# Patient Record
Sex: Female | Born: 1950 | Race: White | Hispanic: No
Health system: Southern US, Community
[De-identification: ages and names within clinical notes are randomized; demographics above are authoritative.]

## PROBLEM LIST (undated history)

## (undated) DIAGNOSIS — I10 Essential (primary) hypertension: Secondary | ICD-10-CM

## (undated) HISTORY — DX: Essential (primary) hypertension: I10

## (undated) HISTORY — PX: OTHER SURGICAL HISTORY: SHX169

---

## 1997-12-29 ENCOUNTER — Other Ambulatory Visit: Admission: RE | Admit: 1997-12-29 | Discharge: 1997-12-29 | Payer: Self-pay | Admitting: Obstetrics and Gynecology

## 1999-01-03 ENCOUNTER — Other Ambulatory Visit: Admission: RE | Admit: 1999-01-03 | Discharge: 1999-01-03 | Payer: Self-pay | Admitting: Obstetrics and Gynecology

## 2000-01-05 ENCOUNTER — Other Ambulatory Visit: Admission: RE | Admit: 2000-01-05 | Discharge: 2000-01-05 | Payer: Self-pay | Admitting: Obstetrics and Gynecology

## 2001-01-07 ENCOUNTER — Other Ambulatory Visit: Admission: RE | Admit: 2001-01-07 | Discharge: 2001-01-07 | Payer: Self-pay | Admitting: Obstetrics and Gynecology

## 2013-09-30 ENCOUNTER — Ambulatory Visit: Payer: Self-pay | Admitting: Podiatrist

## 2017-08-01 DIAGNOSIS — E559 Vitamin D deficiency, unspecified: Secondary | ICD-10-CM | POA: Insufficient documentation

## 2017-08-01 DIAGNOSIS — E039 Hypothyroidism, unspecified: Secondary | ICD-10-CM | POA: Insufficient documentation

## 2017-08-01 DIAGNOSIS — G43009 Migraine without aura, not intractable, without status migrainosus: Secondary | ICD-10-CM | POA: Insufficient documentation

## 2017-08-01 DIAGNOSIS — I10 Essential (primary) hypertension: Secondary | ICD-10-CM | POA: Insufficient documentation

## 2017-12-22 DIAGNOSIS — J069 Acute upper respiratory infection, unspecified: Secondary | ICD-10-CM | POA: Diagnosis not present

## 2017-12-22 DIAGNOSIS — J209 Acute bronchitis, unspecified: Secondary | ICD-10-CM | POA: Diagnosis not present

## 2017-12-22 DIAGNOSIS — J309 Allergic rhinitis, unspecified: Secondary | ICD-10-CM | POA: Diagnosis not present

## 2018-03-06 DIAGNOSIS — M25512 Pain in left shoulder: Secondary | ICD-10-CM | POA: Diagnosis not present

## 2018-03-22 DIAGNOSIS — E7800 Pure hypercholesterolemia, unspecified: Secondary | ICD-10-CM | POA: Diagnosis not present

## 2018-03-22 DIAGNOSIS — E038 Other specified hypothyroidism: Secondary | ICD-10-CM | POA: Diagnosis not present

## 2018-03-22 DIAGNOSIS — I10 Essential (primary) hypertension: Secondary | ICD-10-CM | POA: Diagnosis not present

## 2018-03-22 DIAGNOSIS — Z6824 Body mass index (BMI) 24.0-24.9, adult: Secondary | ICD-10-CM | POA: Diagnosis not present

## 2018-03-22 DIAGNOSIS — F324 Major depressive disorder, single episode, in partial remission: Secondary | ICD-10-CM | POA: Diagnosis not present

## 2018-05-08 DIAGNOSIS — Z1231 Encounter for screening mammogram for malignant neoplasm of breast: Secondary | ICD-10-CM | POA: Diagnosis not present

## 2018-05-13 DIAGNOSIS — H2513 Age-related nuclear cataract, bilateral: Secondary | ICD-10-CM | POA: Diagnosis not present

## 2018-05-13 DIAGNOSIS — H52222 Regular astigmatism, left eye: Secondary | ICD-10-CM | POA: Diagnosis not present

## 2018-06-24 DIAGNOSIS — L570 Actinic keratosis: Secondary | ICD-10-CM | POA: Diagnosis not present

## 2018-06-24 DIAGNOSIS — L821 Other seborrheic keratosis: Secondary | ICD-10-CM | POA: Diagnosis not present

## 2018-08-14 DIAGNOSIS — D2339 Other benign neoplasm of skin of other parts of face: Secondary | ICD-10-CM | POA: Diagnosis not present

## 2018-09-23 DIAGNOSIS — J449 Chronic obstructive pulmonary disease, unspecified: Secondary | ICD-10-CM | POA: Diagnosis not present

## 2018-09-23 DIAGNOSIS — Z1211 Encounter for screening for malignant neoplasm of colon: Secondary | ICD-10-CM | POA: Diagnosis not present

## 2018-09-23 DIAGNOSIS — F17210 Nicotine dependence, cigarettes, uncomplicated: Secondary | ICD-10-CM | POA: Diagnosis not present

## 2018-09-23 DIAGNOSIS — E7800 Pure hypercholesterolemia, unspecified: Secondary | ICD-10-CM | POA: Diagnosis not present

## 2018-09-23 DIAGNOSIS — E785 Hyperlipidemia, unspecified: Secondary | ICD-10-CM | POA: Diagnosis not present

## 2018-09-23 DIAGNOSIS — Z0000 Encounter for general adult medical examination without abnormal findings: Secondary | ICD-10-CM | POA: Diagnosis not present

## 2018-09-23 DIAGNOSIS — Z23 Encounter for immunization: Secondary | ICD-10-CM | POA: Diagnosis not present

## 2018-09-23 DIAGNOSIS — Z79899 Other long term (current) drug therapy: Secondary | ICD-10-CM | POA: Diagnosis not present

## 2018-09-23 DIAGNOSIS — E038 Other specified hypothyroidism: Secondary | ICD-10-CM | POA: Diagnosis not present

## 2018-09-23 DIAGNOSIS — I480 Paroxysmal atrial fibrillation: Secondary | ICD-10-CM | POA: Diagnosis not present

## 2018-09-23 DIAGNOSIS — M8580 Other specified disorders of bone density and structure, unspecified site: Secondary | ICD-10-CM | POA: Diagnosis not present

## 2018-09-23 DIAGNOSIS — I10 Essential (primary) hypertension: Secondary | ICD-10-CM | POA: Diagnosis not present

## 2018-09-23 DIAGNOSIS — H25811 Combined forms of age-related cataract, right eye: Secondary | ICD-10-CM | POA: Diagnosis not present

## 2018-09-23 DIAGNOSIS — R0602 Shortness of breath: Secondary | ICD-10-CM | POA: Diagnosis not present

## 2018-09-23 DIAGNOSIS — J4520 Mild intermittent asthma, uncomplicated: Secondary | ICD-10-CM | POA: Diagnosis not present

## 2018-09-23 DIAGNOSIS — E291 Testicular hypofunction: Secondary | ICD-10-CM | POA: Diagnosis not present

## 2018-09-23 DIAGNOSIS — H5462 Unqualified visual loss, left eye, normal vision right eye: Secondary | ICD-10-CM | POA: Diagnosis not present

## 2018-11-13 DIAGNOSIS — M8589 Other specified disorders of bone density and structure, multiple sites: Secondary | ICD-10-CM | POA: Diagnosis not present

## 2018-11-13 DIAGNOSIS — M810 Age-related osteoporosis without current pathological fracture: Secondary | ICD-10-CM | POA: Diagnosis not present

## 2018-11-13 DIAGNOSIS — M85851 Other specified disorders of bone density and structure, right thigh: Secondary | ICD-10-CM | POA: Diagnosis not present

## 2018-11-26 DIAGNOSIS — F324 Major depressive disorder, single episode, in partial remission: Secondary | ICD-10-CM | POA: Diagnosis not present

## 2018-11-26 DIAGNOSIS — E038 Other specified hypothyroidism: Secondary | ICD-10-CM | POA: Diagnosis not present

## 2018-11-26 DIAGNOSIS — Z6824 Body mass index (BMI) 24.0-24.9, adult: Secondary | ICD-10-CM | POA: Diagnosis not present

## 2018-11-26 DIAGNOSIS — I10 Essential (primary) hypertension: Secondary | ICD-10-CM | POA: Diagnosis not present

## 2018-11-26 DIAGNOSIS — E7800 Pure hypercholesterolemia, unspecified: Secondary | ICD-10-CM | POA: Diagnosis not present

## 2019-02-24 DIAGNOSIS — E7800 Pure hypercholesterolemia, unspecified: Secondary | ICD-10-CM | POA: Diagnosis not present

## 2019-02-24 DIAGNOSIS — I10 Essential (primary) hypertension: Secondary | ICD-10-CM | POA: Diagnosis not present

## 2019-02-24 DIAGNOSIS — M810 Age-related osteoporosis without current pathological fracture: Secondary | ICD-10-CM | POA: Diagnosis not present

## 2019-02-24 DIAGNOSIS — Z6824 Body mass index (BMI) 24.0-24.9, adult: Secondary | ICD-10-CM | POA: Diagnosis not present

## 2019-02-24 DIAGNOSIS — E038 Other specified hypothyroidism: Secondary | ICD-10-CM | POA: Diagnosis not present

## 2019-04-07 DIAGNOSIS — M25512 Pain in left shoulder: Secondary | ICD-10-CM | POA: Diagnosis not present

## 2019-04-07 DIAGNOSIS — M47812 Spondylosis without myelopathy or radiculopathy, cervical region: Secondary | ICD-10-CM | POA: Diagnosis not present

## 2019-07-02 ENCOUNTER — Ambulatory Visit (INDEPENDENT_AMBULATORY_CARE_PROVIDER_SITE_OTHER): Payer: PPO | Admitting: Sports Medicine

## 2019-07-02 ENCOUNTER — Other Ambulatory Visit: Payer: Self-pay | Admitting: Sports Medicine

## 2019-07-02 ENCOUNTER — Ambulatory Visit (INDEPENDENT_AMBULATORY_CARE_PROVIDER_SITE_OTHER): Payer: PPO

## 2019-07-02 ENCOUNTER — Other Ambulatory Visit: Payer: Self-pay

## 2019-07-02 ENCOUNTER — Encounter: Payer: Self-pay | Admitting: Sports Medicine

## 2019-07-02 DIAGNOSIS — M79671 Pain in right foot: Secondary | ICD-10-CM | POA: Diagnosis not present

## 2019-07-02 DIAGNOSIS — M216X1 Other acquired deformities of right foot: Secondary | ICD-10-CM

## 2019-07-02 DIAGNOSIS — M722 Plantar fascial fibromatosis: Secondary | ICD-10-CM

## 2019-07-02 MED ORDER — METHYLPREDNISOLONE 4 MG PO TBPK: ORAL_TABLET | ORAL | 0 refills | Status: DC

## 2019-07-02 NOTE — Patient Instructions (Signed)

## 2019-07-02 NOTE — Progress Notes (Signed)
Subjective: Samantha Oneal is a 69 y.o. female patient presents to office with complaint of moderate heel and arch pain on the right. 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 Patient has treated this problem with changing shoes with no relief. Reports she feels a pulling and stinging sensation that worsens when applying pressure, yesterday it hurt all day long. Denies any other pedal complaints.   Review of Systems  All other systems reviewed and are negative.   There are no problems to display for this patient.   No current outpatient medications on file prior to visit.   No current facility-administered medications on file prior to visit.    Allergies  Allergen Reactions  . Latex Itching and Swelling  . Pseudoephedrine Hcl Rash    Objective: Physical Exam General: The patient is alert and oriented x3 in no acute distress.  Dermatology: Skin is warm, dry and supple bilateral lower extremities. Nails 1-10 are normal. There is no erythema, edema, no eccymosis, no open lesions present. Integument is otherwise unremarkable.  Vascular: Dorsalis Pedis pulse and Posterior Tibial pulse are 2/4 bilateral. Capillary fill time is immediate to all digits.  Neurological: Grossly intact to light touch with an achilles reflex of +2/5 and a  negative Tinel's sign bilateral.  Musculoskeletal: Tenderness to palpation at the medial calcaneal tubercale and through the insertion of the plantar fascia on the right and arch on the right foot. No pain with compression of calcaneus bilateral. No pain with tuning fork to calcaneus bilateral. No pain with calf compression bilateral. There is decreased Ankle joint range of motion bilateral. All other joints range of motion within normal limits bilateral. Strength 5/5 in all groups bilateral.   Gait: Unassisted, Antalgic avoid weight on right heel  Xray, Right foot:  Normal osseous  mineralization. Joint spaces preserved except at midtarsal and ankle with varus position of metatarsals. No fracture/dislocation/boney destruction. Calcaneal spur present with mild thickening of plantar fascia. No other soft tissue abnormalities or radiopaque foreign bodies.   Assessment and Plan: Problem List Items Addressed This Visit    None    Visit Diagnoses    Plantar fasciitis    -  Primary   Acquired equinus deformity of right foot       Right foot pain         -Complete examination performed.  -Xrays reviewed -Discussed with patient in detail the condition of plantar fasciitis with equinus, how this occurs and general treatment options. Explained both conservative and surgical treatments.  -Rx Medrol and wait at least 1 week after COVID vaccination  -Recommended good supportive shoes -Explained in detail the use of the right night splint which was dispensed at today's visit -Explained and dispensed to patient daily stretching exercises -Recommend patient to ice affected area 1-2x daily -Patient to return to office in 4 weeks for follow up or sooner if problems or questions arise.  Landis Martins, DPM

## 2019-07-03 ENCOUNTER — Other Ambulatory Visit: Payer: Self-pay | Admitting: Sports Medicine

## 2019-07-03 DIAGNOSIS — M722 Plantar fascial fibromatosis: Secondary | ICD-10-CM

## 2019-07-07 DIAGNOSIS — Z1231 Encounter for screening mammogram for malignant neoplasm of breast: Secondary | ICD-10-CM | POA: Diagnosis not present

## 2019-07-30 ENCOUNTER — Ambulatory Visit: Payer: PPO | Admitting: Sports Medicine

## 2019-08-08 ENCOUNTER — Other Ambulatory Visit: Payer: Self-pay

## 2019-08-08 ENCOUNTER — Ambulatory Visit (INDEPENDENT_AMBULATORY_CARE_PROVIDER_SITE_OTHER): Payer: PPO | Admitting: Sports Medicine

## 2019-08-08 ENCOUNTER — Encounter: Payer: Self-pay | Admitting: Sports Medicine

## 2019-08-08 DIAGNOSIS — M722 Plantar fascial fibromatosis: Secondary | ICD-10-CM | POA: Diagnosis not present

## 2019-08-08 DIAGNOSIS — M216X1 Other acquired deformities of right foot: Secondary | ICD-10-CM

## 2019-08-08 DIAGNOSIS — M79671 Pain in right foot: Secondary | ICD-10-CM

## 2019-08-08 MED ORDER — TRIAMCINOLONE ACETONIDE 10 MG/ML IJ SUSP
10.0000 mg | Freq: Once | INTRAMUSCULAR | Status: AC
  Administered 2019-08-08: 12:00:00 10 mg

## 2019-08-08 NOTE — Progress Notes (Signed)
Subjective: Samantha Oneal is a 69 y.o. female returns to office for follow up evaluation right heel pain reports that he got a little better after finishing the steroid but now it seems like it is slowly starting to get back to how painful it was pain is 7 out of 10 worse in the morning with first few steps out of bed.  Reports that she has been doing her stretching and wearing her night splint but does admit sometimes she has cramping.  Patient denies any recent changes in medications or new problems since last visit.   Reports that she has had her Covid vaccination without any issues or interactions with the steroid Dosepak besides just feeling jittery when she is on the [place] which she has had before without any other problems and was able to complete the medication without any issues.  Patient Active Problem List   Diagnosis Date Noted  . Acquired hypothyroidism 08/01/2017  . Essential hypertension 08/01/2017  . Migraine without aura or status migrainosus 08/01/2017  . Vitamin D deficiency 08/01/2017    Current Outpatient Medications on File Prior to Visit  Medication Sig Dispense Refill  . alendronate (FOSAMAX) 70 MG tablet Take 70 mg by mouth every morning.    Marland Kitchen FLUoxetine (PROZAC) 20 MG capsule Take 20 mg by mouth every morning.    . NP THYROID 60 MG tablet     . quinapril (ACCUPRIL) 20 MG tablet Take 20 mg by mouth daily.    . traZODone (DESYREL) 50 MG tablet TAKE 1/2 1 TABLET BY MOUTH AT BEDTIME     No current facility-administered medications on file prior to visit.    Allergies  Allergen Reactions  . Latex Itching and Swelling  . Pseudoephedrine Hcl Rash    Objective:   General:  Alert and oriented x 3, in no acute distress  Dermatology: Skin is warm, dry, and supple bilateral. Nails are within normal limits. There is no lower extremity erythema, no eccymosis, no open lesions present bilateral.   Vascular: Dorsalis Pedis 2/4 and Posterior Tibial pedal pulses are 1/4  bilateral. + hair growth noted bilateral. Capillary Fill Time is 3 seconds in all digits.  Mild varicosities, No edema bilateral lower extremities.   Neurological: Sensation grossly intact to light touch with an achilles reflex of +2 and a  negative Tinel's sign bilateral. Vibratory, sharp/dull, Semmes Weinstein Monofilament within normal limits.   Musculoskeletal: There is residual tenderness to palpation at the medial calcaneal tubercale and through the insertion of the plantar fascia on the right foot. No pain with compression to calcaneus or application of tuning fork. There is decreased Ankle joint range of motion bilateral. All other jointsrange of motion  within normal limits bilateral. Strength 5/5 bilateral.   Assessment and Plan: Problem List Items Addressed This Visit    None    Visit Diagnoses    Plantar fasciitis    -  Primary   Relevant Medications   triamcinolone acetonide (KENALOG) 10 MG/ML injection 10 mg (Completed) (Start on 08/08/2019 12:15 PM)   Acquired equinus deformity of right foot       Right foot pain          -Complete examination performed.  -Previous x-rays reviewed. -Discussed with patient in detail the condition of plantar fasciitis, how this  occurs related to the foot type of the patient and general treatment options. - Patient opted for injection today; After oral consent and aseptic prep, injected a mixture containing 1 ml of  1%plain lidocaine, 1 ml 0.5% plain marcaine, 0.5 ml of kenalog 10 and 0.5 ml of dexmethasone phosphate to right heel at area of most pain/trigger point injection. -Dispensed plantar fascial brace for the right for patient to use as instructed -Continue with night splint to use as instructed previously -Dispensed heel lifts for patient to use in her walking shoes -Continue with stretching, icing, good supportive shoes daily and avoid walking barefoot at home -Discussed long term care and reocurrence; will closely monitor; if fails  to improve will consider other treatment modalities.  -Patient to return to office in 1 month for follow up or sooner if problems or questions arise.  Landis Martins, DPM

## 2019-09-05 ENCOUNTER — Ambulatory Visit: Payer: PPO | Admitting: Sports Medicine

## 2019-09-29 DIAGNOSIS — E7800 Pure hypercholesterolemia, unspecified: Secondary | ICD-10-CM | POA: Diagnosis not present

## 2019-09-29 DIAGNOSIS — M810 Age-related osteoporosis without current pathological fracture: Secondary | ICD-10-CM | POA: Diagnosis not present

## 2019-09-29 DIAGNOSIS — E038 Other specified hypothyroidism: Secondary | ICD-10-CM | POA: Diagnosis not present

## 2019-09-29 DIAGNOSIS — F3342 Major depressive disorder, recurrent, in full remission: Secondary | ICD-10-CM | POA: Diagnosis not present

## 2019-09-29 DIAGNOSIS — I10 Essential (primary) hypertension: Secondary | ICD-10-CM | POA: Diagnosis not present

## 2019-09-29 DIAGNOSIS — Z6825 Body mass index (BMI) 25.0-25.9, adult: Secondary | ICD-10-CM | POA: Diagnosis not present

## 2019-09-29 DIAGNOSIS — Z0000 Encounter for general adult medical examination without abnormal findings: Secondary | ICD-10-CM | POA: Diagnosis not present

## 2019-11-25 DIAGNOSIS — E038 Other specified hypothyroidism: Secondary | ICD-10-CM | POA: Diagnosis not present

## 2019-11-25 DIAGNOSIS — E7800 Pure hypercholesterolemia, unspecified: Secondary | ICD-10-CM | POA: Diagnosis not present

## 2020-02-11 ENCOUNTER — Encounter: Payer: Self-pay | Admitting: Sports Medicine

## 2020-02-11 ENCOUNTER — Other Ambulatory Visit: Payer: Self-pay

## 2020-02-11 ENCOUNTER — Ambulatory Visit (INDEPENDENT_AMBULATORY_CARE_PROVIDER_SITE_OTHER): Payer: PPO | Admitting: Sports Medicine

## 2020-02-11 DIAGNOSIS — M216X1 Other acquired deformities of right foot: Secondary | ICD-10-CM

## 2020-02-11 DIAGNOSIS — M722 Plantar fascial fibromatosis: Secondary | ICD-10-CM | POA: Diagnosis not present

## 2020-02-11 DIAGNOSIS — M79671 Pain in right foot: Secondary | ICD-10-CM

## 2020-02-11 MED ORDER — TRIAMCINOLONE ACETONIDE 10 MG/ML IJ SUSP
10.0000 mg | Freq: Once | INTRAMUSCULAR | Status: AC
  Administered 2020-02-11: 10 mg

## 2020-02-11 NOTE — Progress Notes (Signed)
Subjective: Samantha Oneal is a 69 y.o. female returns to office for follow up evaluation right heel pain reports that her pain has flared up again but now it seems like it is on the lateral side and sometimes it radiates up the calf and affects her sciatica reports that there is no swelling but her balance is off and that the pain sometimes is a throbbing aching pain sometimes at worst 8 out of 10 has been stretching and using night splint without complete relief.  Patient denies any other pedal complaints at this time.  Reports that she had a lot going on and was recovering after getting a Covid vaccination so she did not follow-up.  Patient denies any other acute problems or symptoms at this time.  Patient Active Problem List   Diagnosis Date Noted  . Acquired hypothyroidism 08/01/2017  . Essential hypertension 08/01/2017  . Migraine without aura or status migrainosus 08/01/2017  . Vitamin D deficiency 08/01/2017    Current Outpatient Medications on File Prior to Visit  Medication Sig Dispense Refill  . alendronate (FOSAMAX) 70 MG tablet Take 70 mg by mouth every morning.    Marland Kitchen FLUoxetine (PROZAC) 20 MG capsule Take 20 mg by mouth every morning.    . NP THYROID 60 MG tablet     . quinapril (ACCUPRIL) 20 MG tablet Take 20 mg by mouth daily.    . traZODone (DESYREL) 50 MG tablet TAKE 1/2 1 TABLET BY MOUTH AT BEDTIME     No current facility-administered medications on file prior to visit.    Allergies  Allergen Reactions  . Latex Itching and Swelling  . Pseudoephedrine Hcl Rash    Objective:   General:  Alert and oriented x 3, in no acute distress  Dermatology: Skin is warm, dry, and supple bilateral. Nails are within normal limits. There is no lower extremity erythema, no eccymosis, no open lesions present bilateral.   Vascular: Dorsalis Pedis 2/4 and Posterior Tibial pedal pulses are 1/4 bilateral. + hair growth noted bilateral. Capillary Fill Time is 3 seconds in all digits.  Mild  varicosities, No edema bilateral lower extremities.   Neurological: Sensation grossly intact to light touch bilateral.  Musculoskeletal: There is residual tenderness to palpation at the now lateral calcaneal tubercale and through the insertion of the plantar fascia on the right foot.  No reproducible pain medial.  No pain with compression to calcaneus or application of tuning fork. There is decreased Ankle joint range of motion bilateral. All other jointsrange of motion  within normal limits bilateral. Strength 5/5 bilateral.   Assessment and Plan: Problem List Items Addressed This Visit    None    Visit Diagnoses    Plantar fasciitis    -  Primary   Acquired equinus deformity of right foot       Right foot pain          -Complete examination performed.  -Previous x-rays reviewed. -Discussed with patient in detail the condition of plantar fasciitis, how this  occurs related to the foot type of the patient and general treatment options. - Patient opted for injection today; After oral consent and aseptic prep, injected a mixture containing 1 ml of 1%plain lidocaine, 1 ml 0.5% plain marcaine, 0.5 ml of kenalog 10 and 0.5 ml of dexmethasone phosphate to right heel at area of most pain/trigger point injection now at the lateral aspect of the heel. -Dispensed a replacement plantar fascial brace to use as instructed on the right -Continue  with night splint, heel lifts, stretching icing good supportive shoes daily -Discussed long term care and reocurrence; will closely monitor; if fails to improve will consider other treatment modalities.  -Patient to return to office in 2 weeks for follow up or sooner if problems or questions arise.  Discussed with patient if pain is not better may consider adding on physical therapy versus putting her temporarily in a walking cam boot.  Landis Martins, DPM

## 2020-02-27 ENCOUNTER — Ambulatory Visit (INDEPENDENT_AMBULATORY_CARE_PROVIDER_SITE_OTHER): Payer: PPO | Admitting: Sports Medicine

## 2020-02-27 ENCOUNTER — Encounter: Payer: Self-pay | Admitting: Sports Medicine

## 2020-02-27 ENCOUNTER — Other Ambulatory Visit: Payer: Self-pay

## 2020-02-27 DIAGNOSIS — M722 Plantar fascial fibromatosis: Secondary | ICD-10-CM | POA: Diagnosis not present

## 2020-02-27 DIAGNOSIS — M216X1 Other acquired deformities of right foot: Secondary | ICD-10-CM | POA: Diagnosis not present

## 2020-02-27 DIAGNOSIS — M79671 Pain in right foot: Secondary | ICD-10-CM | POA: Diagnosis not present

## 2020-02-27 NOTE — Progress Notes (Signed)
Subjective: Samantha Oneal is a 69 y.o. female returns to office for follow up evaluation right heel pain reports that her pain is much better 1-2 out of 10.  Patient reports that she has been consistent with stretching icing and wearing a brace which seems to be helpful.  No other pedal complaints noted at this time.  Patient Active Problem List   Diagnosis Date Noted  . Acquired hypothyroidism 08/01/2017  . Essential hypertension 08/01/2017  . Migraine without aura or status migrainosus 08/01/2017  . Vitamin D deficiency 08/01/2017    Current Outpatient Medications on File Prior to Visit  Medication Sig Dispense Refill  . alendronate (FOSAMAX) 70 MG tablet Take 70 mg by mouth every morning.    Marland Kitchen FLUoxetine (PROZAC) 20 MG capsule Take 20 mg by mouth every morning.    . NP THYROID 60 MG tablet     . quinapril (ACCUPRIL) 20 MG tablet Take 20 mg by mouth daily.    . traZODone (DESYREL) 50 MG tablet TAKE 1/2 1 TABLET BY MOUTH AT BEDTIME     No current facility-administered medications on file prior to visit.    Allergies  Allergen Reactions  . Latex Itching and Swelling  . Pseudoephedrine Hcl Rash    Objective:   General:  Alert and oriented x 3, in no acute distress  Dermatology: Skin is warm, dry, and supple bilateral. Nails are within normal limits. There is no lower extremity erythema, no eccymosis, no open lesions present bilateral.   Vascular: Dorsalis Pedis 2/4 and Posterior Tibial pedal pulses are 1/4 bilateral. + hair growth noted bilateral. Capillary Fill Time is 3 seconds in all digits.  Mild varicosities, No edema bilateral lower extremities.   Neurological: Sensation grossly intact to light touch bilateral.  Musculoskeletal: There is no reproducible tenderness to palpation at the calcaneal tubercale and through the insertion of the plantar fascia on the right foot.  No reproducible pain medial or lateral noted this visit on the right heel.  No pain with compression to  calcaneus or application of tuning fork. There is decreased Ankle joint range of motion bilateral. All other jointsrange of motion  within normal limits bilateral. Strength 5/5 bilateral.   Assessment and Plan: Problem List Items Addressed This Visit    None    Visit Diagnoses    Plantar fasciitis    -  Primary   Acquired equinus deformity of right foot       Right foot pain          -Complete examination performed.  -Patient may slowly wean from plantar fascial brace to use as instructed on the right -Continue with night splint, heel lifts, stretching icing good supportive shoes daily -Discussed long term care and reocurrence; will closely monitor; if fails to improve will consider other treatment modalities.  -Patient to return to office as needed or call if pain flares advised patient may benefit from physical therapy if pain is slowly increasing especially since she has a significant history of sciatica.  Landis Martins, DPM

## 2020-04-05 DIAGNOSIS — I10 Essential (primary) hypertension: Secondary | ICD-10-CM | POA: Diagnosis not present

## 2020-04-05 DIAGNOSIS — Z6826 Body mass index (BMI) 26.0-26.9, adult: Secondary | ICD-10-CM | POA: Diagnosis not present

## 2020-04-05 DIAGNOSIS — E7800 Pure hypercholesterolemia, unspecified: Secondary | ICD-10-CM | POA: Diagnosis not present

## 2020-04-05 DIAGNOSIS — E038 Other specified hypothyroidism: Secondary | ICD-10-CM | POA: Diagnosis not present

## 2020-04-05 DIAGNOSIS — R7301 Impaired fasting glucose: Secondary | ICD-10-CM | POA: Diagnosis not present

## 2020-08-09 DIAGNOSIS — Z1231 Encounter for screening mammogram for malignant neoplasm of breast: Secondary | ICD-10-CM | POA: Diagnosis not present

## 2020-10-18 DIAGNOSIS — I10 Essential (primary) hypertension: Secondary | ICD-10-CM | POA: Diagnosis not present

## 2020-10-18 DIAGNOSIS — Z0000 Encounter for general adult medical examination without abnormal findings: Secondary | ICD-10-CM | POA: Diagnosis not present

## 2020-10-18 DIAGNOSIS — M810 Age-related osteoporosis without current pathological fracture: Secondary | ICD-10-CM | POA: Diagnosis not present

## 2020-10-18 DIAGNOSIS — F3342 Major depressive disorder, recurrent, in full remission: Secondary | ICD-10-CM | POA: Diagnosis not present

## 2020-10-18 DIAGNOSIS — E7800 Pure hypercholesterolemia, unspecified: Secondary | ICD-10-CM | POA: Diagnosis not present

## 2020-10-18 DIAGNOSIS — E038 Other specified hypothyroidism: Secondary | ICD-10-CM | POA: Diagnosis not present

## 2020-10-25 DIAGNOSIS — L814 Other melanin hyperpigmentation: Secondary | ICD-10-CM | POA: Diagnosis not present

## 2020-10-25 DIAGNOSIS — D485 Neoplasm of uncertain behavior of skin: Secondary | ICD-10-CM | POA: Diagnosis not present

## 2020-10-25 DIAGNOSIS — L820 Inflamed seborrheic keratosis: Secondary | ICD-10-CM | POA: Diagnosis not present

## 2020-10-25 DIAGNOSIS — D1801 Hemangioma of skin and subcutaneous tissue: Secondary | ICD-10-CM | POA: Diagnosis not present

## 2020-12-02 DIAGNOSIS — Z79899 Other long term (current) drug therapy: Secondary | ICD-10-CM | POA: Diagnosis not present

## 2021-03-23 DIAGNOSIS — J0100 Acute maxillary sinusitis, unspecified: Secondary | ICD-10-CM | POA: Diagnosis not present

## 2021-03-23 DIAGNOSIS — R1084 Generalized abdominal pain: Secondary | ICD-10-CM | POA: Diagnosis not present

## 2021-04-21 DIAGNOSIS — E7800 Pure hypercholesterolemia, unspecified: Secondary | ICD-10-CM | POA: Diagnosis not present

## 2021-04-21 DIAGNOSIS — F324 Major depressive disorder, single episode, in partial remission: Secondary | ICD-10-CM | POA: Diagnosis not present

## 2021-04-21 DIAGNOSIS — I10 Essential (primary) hypertension: Secondary | ICD-10-CM | POA: Diagnosis not present

## 2021-04-21 DIAGNOSIS — E038 Other specified hypothyroidism: Secondary | ICD-10-CM | POA: Diagnosis not present

## 2021-04-21 DIAGNOSIS — Z6825 Body mass index (BMI) 25.0-25.9, adult: Secondary | ICD-10-CM | POA: Diagnosis not present

## 2021-07-21 DIAGNOSIS — I10 Essential (primary) hypertension: Secondary | ICD-10-CM | POA: Diagnosis not present

## 2021-07-21 DIAGNOSIS — Z6825 Body mass index (BMI) 25.0-25.9, adult: Secondary | ICD-10-CM | POA: Diagnosis not present

## 2021-07-21 DIAGNOSIS — E038 Other specified hypothyroidism: Secondary | ICD-10-CM | POA: Diagnosis not present

## 2021-07-21 DIAGNOSIS — Z1231 Encounter for screening mammogram for malignant neoplasm of breast: Secondary | ICD-10-CM | POA: Diagnosis not present

## 2021-07-21 DIAGNOSIS — E7800 Pure hypercholesterolemia, unspecified: Secondary | ICD-10-CM | POA: Diagnosis not present

## 2021-08-20 DIAGNOSIS — M5430 Sciatica, unspecified side: Secondary | ICD-10-CM | POA: Diagnosis not present

## 2021-08-20 DIAGNOSIS — R300 Dysuria: Secondary | ICD-10-CM | POA: Diagnosis not present

## 2021-09-06 DIAGNOSIS — Z1231 Encounter for screening mammogram for malignant neoplasm of breast: Secondary | ICD-10-CM | POA: Diagnosis not present

## 2021-10-06 DIAGNOSIS — E7800 Pure hypercholesterolemia, unspecified: Secondary | ICD-10-CM | POA: Diagnosis not present

## 2021-10-06 DIAGNOSIS — Z6825 Body mass index (BMI) 25.0-25.9, adult: Secondary | ICD-10-CM | POA: Diagnosis not present

## 2021-10-06 DIAGNOSIS — I10 Essential (primary) hypertension: Secondary | ICD-10-CM | POA: Diagnosis not present

## 2021-10-06 DIAGNOSIS — F324 Major depressive disorder, single episode, in partial remission: Secondary | ICD-10-CM | POA: Diagnosis not present

## 2021-10-06 DIAGNOSIS — E038 Other specified hypothyroidism: Secondary | ICD-10-CM | POA: Diagnosis not present

## 2021-10-27 DIAGNOSIS — D225 Melanocytic nevi of trunk: Secondary | ICD-10-CM | POA: Diagnosis not present

## 2021-10-27 DIAGNOSIS — L814 Other melanin hyperpigmentation: Secondary | ICD-10-CM | POA: Diagnosis not present

## 2021-10-27 DIAGNOSIS — L578 Other skin changes due to chronic exposure to nonionizing radiation: Secondary | ICD-10-CM | POA: Diagnosis not present

## 2021-10-27 DIAGNOSIS — L820 Inflamed seborrheic keratosis: Secondary | ICD-10-CM | POA: Diagnosis not present

## 2021-11-08 DIAGNOSIS — M5416 Radiculopathy, lumbar region: Secondary | ICD-10-CM | POA: Diagnosis not present

## 2021-11-08 DIAGNOSIS — Z6825 Body mass index (BMI) 25.0-25.9, adult: Secondary | ICD-10-CM | POA: Diagnosis not present

## 2021-11-09 ENCOUNTER — Other Ambulatory Visit: Payer: Self-pay | Admitting: Neurological Surgery

## 2021-11-09 DIAGNOSIS — M5416 Radiculopathy, lumbar region: Secondary | ICD-10-CM

## 2021-11-22 ENCOUNTER — Ambulatory Visit
Admission: RE | Admit: 2021-11-22 | Discharge: 2021-11-22 | Disposition: A | Discharge status: Home / Self Care | Payer: PPO | Source: Ambulatory Visit | Attending: Neurological Surgery | Admitting: Neurological Surgery

## 2021-11-22 DIAGNOSIS — M4126 Other idiopathic scoliosis, lumbar region: Secondary | ICD-10-CM | POA: Diagnosis not present

## 2021-11-22 DIAGNOSIS — M545 Low back pain, unspecified: Secondary | ICD-10-CM | POA: Diagnosis not present

## 2021-11-22 DIAGNOSIS — M5416 Radiculopathy, lumbar region: Secondary | ICD-10-CM

## 2021-11-30 DIAGNOSIS — M5416 Radiculopathy, lumbar region: Secondary | ICD-10-CM | POA: Diagnosis not present

## 2021-12-06 DIAGNOSIS — Z6826 Body mass index (BMI) 26.0-26.9, adult: Secondary | ICD-10-CM | POA: Diagnosis not present

## 2021-12-06 DIAGNOSIS — I10 Essential (primary) hypertension: Secondary | ICD-10-CM | POA: Diagnosis not present

## 2022-02-08 DIAGNOSIS — M5416 Radiculopathy, lumbar region: Secondary | ICD-10-CM | POA: Diagnosis not present

## 2022-02-22 DIAGNOSIS — E7800 Pure hypercholesterolemia, unspecified: Secondary | ICD-10-CM | POA: Diagnosis not present

## 2022-02-22 DIAGNOSIS — E038 Other specified hypothyroidism: Secondary | ICD-10-CM | POA: Diagnosis not present

## 2022-02-22 DIAGNOSIS — I10 Essential (primary) hypertension: Secondary | ICD-10-CM | POA: Diagnosis not present

## 2022-02-24 DIAGNOSIS — Z0000 Encounter for general adult medical examination without abnormal findings: Secondary | ICD-10-CM | POA: Diagnosis not present

## 2022-02-24 DIAGNOSIS — I10 Essential (primary) hypertension: Secondary | ICD-10-CM | POA: Diagnosis not present

## 2022-02-24 DIAGNOSIS — F3342 Major depressive disorder, recurrent, in full remission: Secondary | ICD-10-CM | POA: Diagnosis not present

## 2022-02-24 DIAGNOSIS — E7800 Pure hypercholesterolemia, unspecified: Secondary | ICD-10-CM | POA: Diagnosis not present

## 2022-02-24 DIAGNOSIS — Z6825 Body mass index (BMI) 25.0-25.9, adult: Secondary | ICD-10-CM | POA: Diagnosis not present

## 2022-02-24 DIAGNOSIS — E038 Other specified hypothyroidism: Secondary | ICD-10-CM | POA: Diagnosis not present

## 2022-02-24 DIAGNOSIS — M810 Age-related osteoporosis without current pathological fracture: Secondary | ICD-10-CM | POA: Diagnosis not present

## 2022-02-28 DIAGNOSIS — M5416 Radiculopathy, lumbar region: Secondary | ICD-10-CM | POA: Diagnosis not present

## 2022-03-08 DIAGNOSIS — L209 Atopic dermatitis, unspecified: Secondary | ICD-10-CM | POA: Diagnosis not present

## 2022-03-08 DIAGNOSIS — L299 Pruritus, unspecified: Secondary | ICD-10-CM | POA: Diagnosis not present

## 2022-03-23 DIAGNOSIS — M8589 Other specified disorders of bone density and structure, multiple sites: Secondary | ICD-10-CM | POA: Diagnosis not present

## 2022-04-15 DIAGNOSIS — J029 Acute pharyngitis, unspecified: Secondary | ICD-10-CM | POA: Diagnosis not present

## 2022-04-15 DIAGNOSIS — J0100 Acute maxillary sinusitis, unspecified: Secondary | ICD-10-CM | POA: Diagnosis not present

## 2022-05-06 DIAGNOSIS — J069 Acute upper respiratory infection, unspecified: Secondary | ICD-10-CM | POA: Diagnosis not present

## 2022-05-06 DIAGNOSIS — J040 Acute laryngitis: Secondary | ICD-10-CM | POA: Diagnosis not present

## 2022-05-06 DIAGNOSIS — R059 Cough, unspecified: Secondary | ICD-10-CM | POA: Diagnosis not present

## 2022-06-30 DIAGNOSIS — M47812 Spondylosis without myelopathy or radiculopathy, cervical region: Secondary | ICD-10-CM | POA: Diagnosis not present

## 2022-06-30 DIAGNOSIS — M25512 Pain in left shoulder: Secondary | ICD-10-CM | POA: Diagnosis not present

## 2022-07-01 IMAGING — MR MR LUMBAR SPINE W/O CM
4 of 6 series · 19 of 48 positions shown · non-contrast
Comparison: None Available.

CLINICAL DATA: 70-year-old female with persistent low back pain.
Prior spinal injections with improvement.

EXAM:
MRI LUMBAR SPINE WITHOUT CONTRAST
TECHNIQUE: Multiplanar, multisequence MR imaging of the lumbar spine was
performed. No intravenous contrast was administered.

[Series 5: T2 · sagittal · 4.0mm · 0.73mm/px · 5 of 18 slices shown (1 of 3)]
[im 1/18]
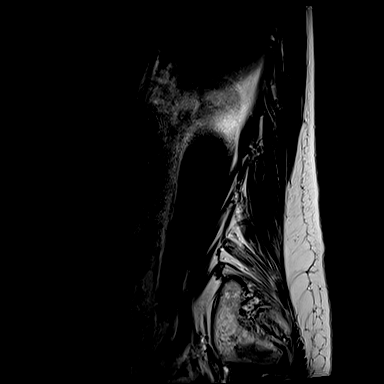
[im 5/18]
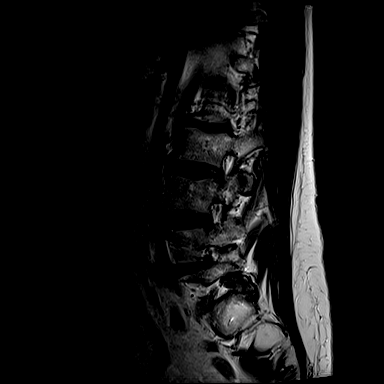
[im 9/18]
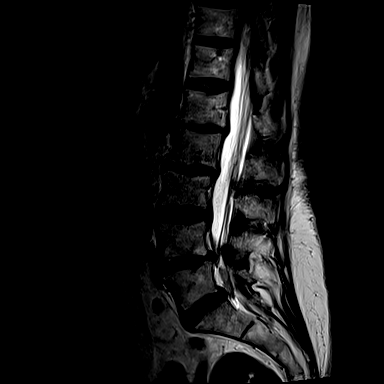
[im 13/18]
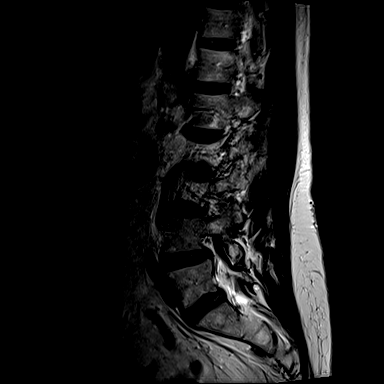
[im 18/18]
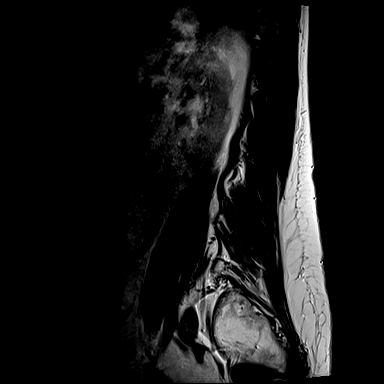

[Series 6: T1 · sagittal · 4.0mm · 0.73mm/px · 3 of 18 slices shown]
[im 1/18]
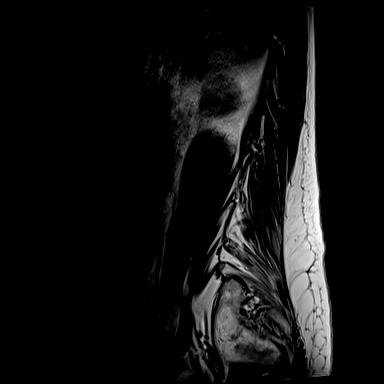
[im 9/18]
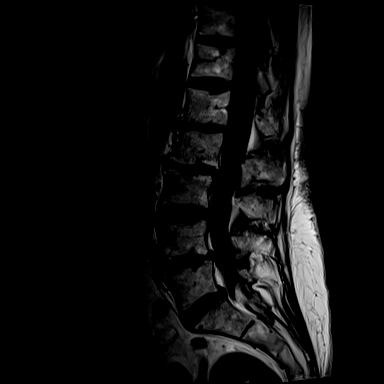
[im 18/18]
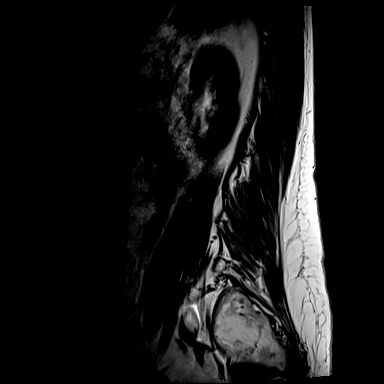

[Series 13: T2 · axial · 4.0mm · 0.28mm/px · z∈[-75,+118]mm · 8 of 41 slices shown (2 of 3)]
[im 1/41]
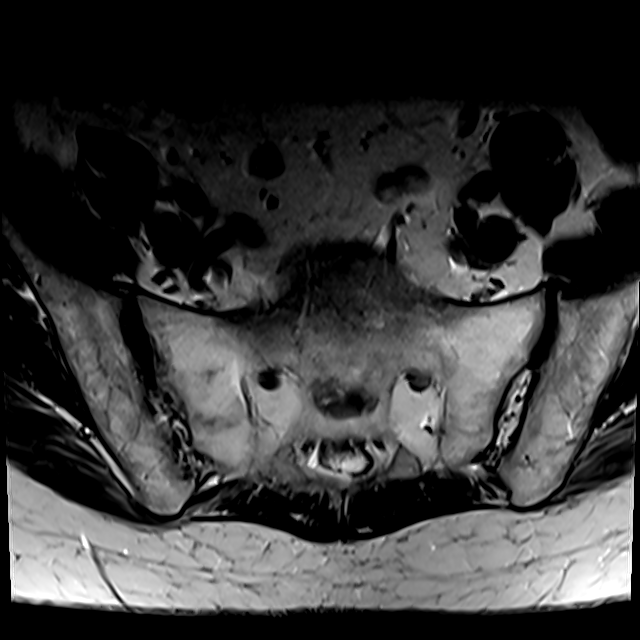
[im 7/41]
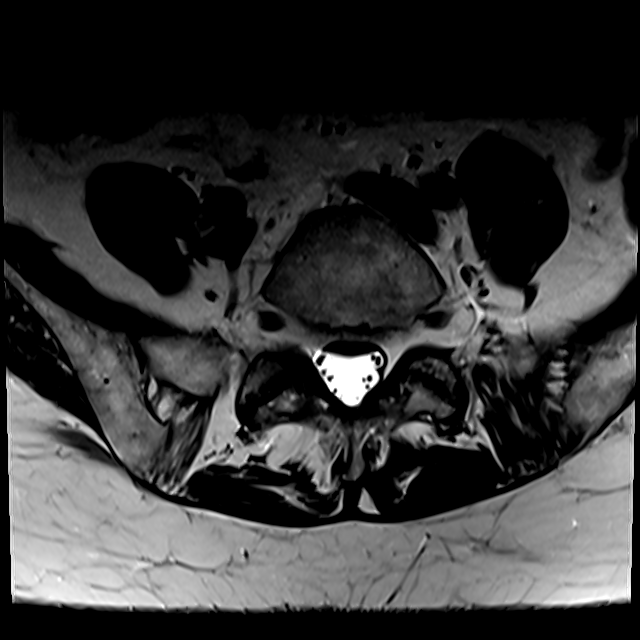
[im 14/41]
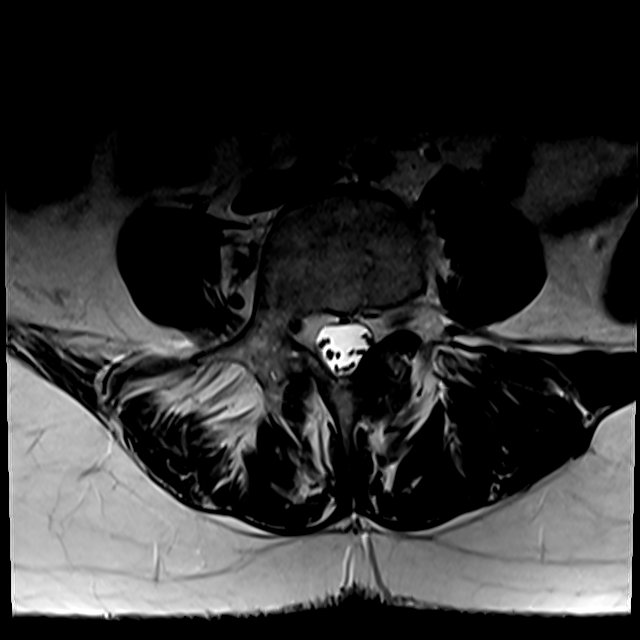
[im 17/41]
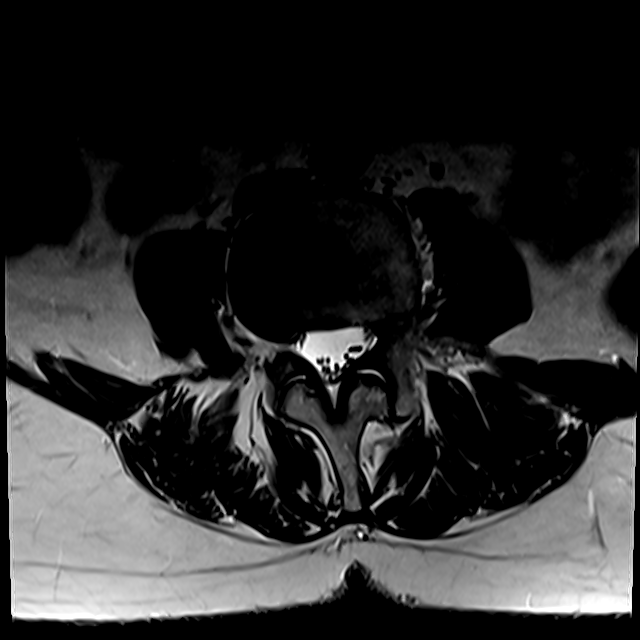
[im 21/41]
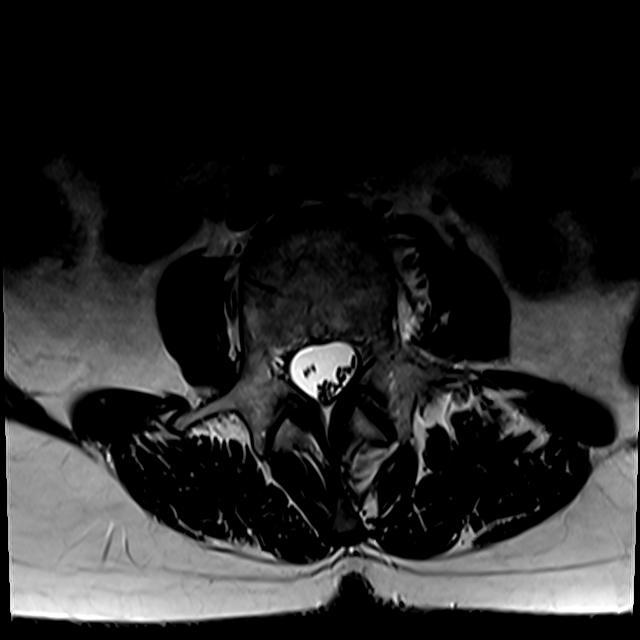
[im 24/41]
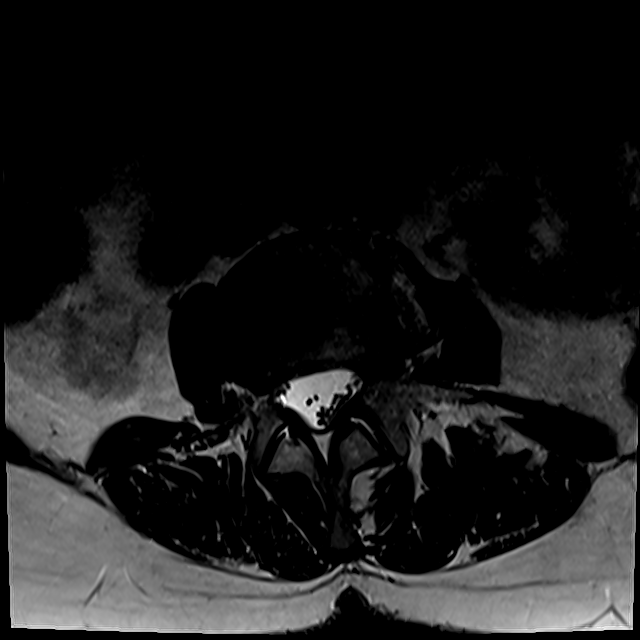
[im 27/41]
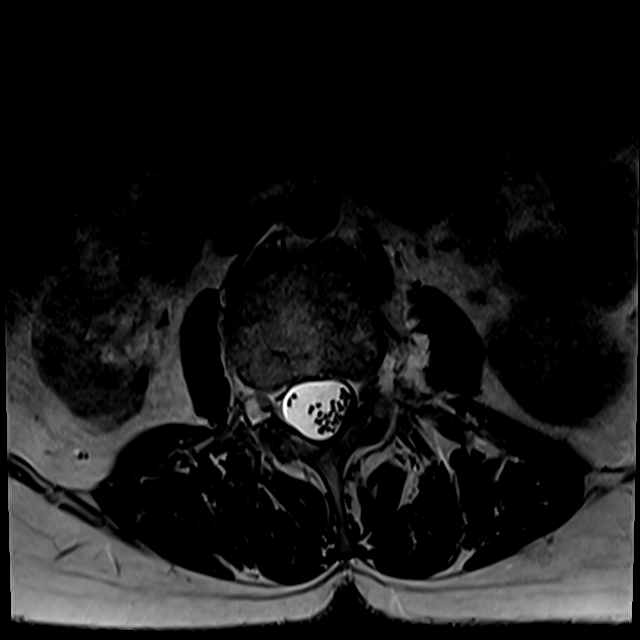
[im 34/41]
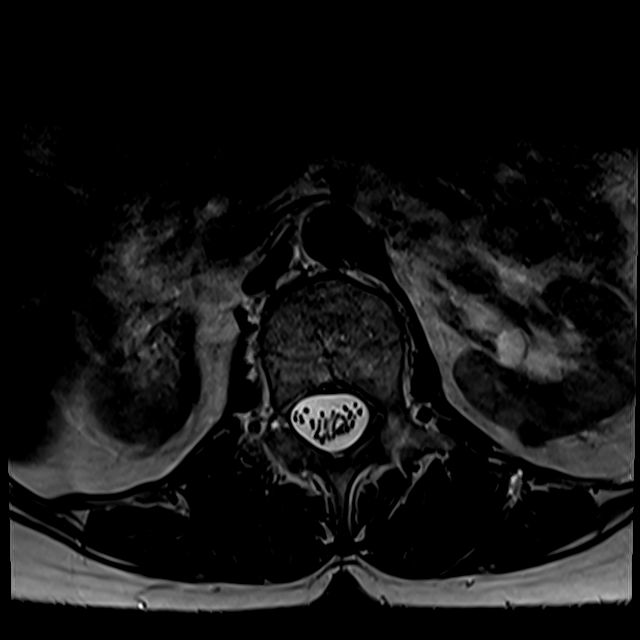

[Series 14: T2 · coronal · 5.0mm · 0.73mm/px · 3 of 18 slices shown (3 of 3)]
[im 4/18]
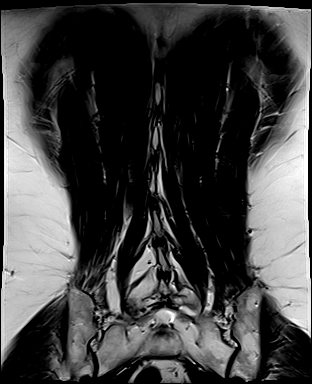
[im 11/18]
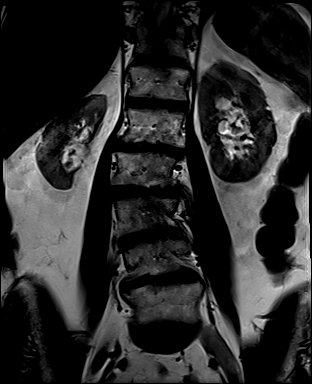
[im 18/18]
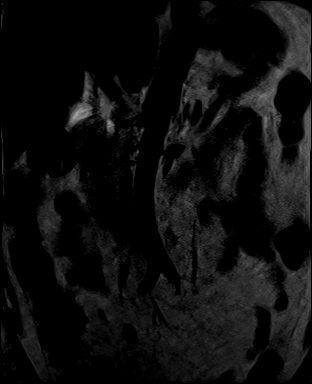

[19 of 48 positions shown; findings below may reference images not displayed]

FINDINGS: Segmentation: Lumbar segmentation appears to be normal and will be
designated as such for this report.

Alignment: Mild to moderate dextroconvex lumbar scoliosis (series
14, image 7) apex at L2. Mild associated straightening of lumbar
lordosis. No significant spondylolisthesis.

Vertebrae: Chronic and degenerative endplate changes in the lumbar
spine. Normal background bone marrow signal. No marrow edema or
evidence of acute osseous abnormality. Intact visible sacrum and SI
joints.

Conus medullaris and cauda equina: Conus extends to the T12 level.
No lower spinal cord or conus signal abnormality. Capacious spinal
canal at most levels and normal cauda equina nerve roots.

Paraspinal and other soft tissues: Partial atrophy of the right
kidney (series 14, image 8). Otherwise negative.

Disc levels:

Visible lower thoracic levels through T12-L1 are normal.

L1-L2: Disc desiccation and disc space loss. Mild circumferential
disc bulge eccentric to the right. No stenosis.

L2-L3: Disc desiccation and disc space loss. Circumferential disc
bulge and endplate spurring with bulky left far lateral component.
No spinal or convincing lateral recess stenosis. Mild to moderate
left L2 neural foraminal stenosis.

L3-L4: Less pronounced disc space loss. Leftward disc bulging. Mild
facet hypertrophy greater on the left. No spinal stenosis.
Borderline to mild left lateral recess stenosis (left L4 nerve
level) and left L3 foraminal stenosis.

L4-L5: Disc space loss with right eccentric circumferential disc
bulge and endplate spurring. Mild facet hypertrophy. No spinal or
convincing lateral recess stenosis. Moderate right L4 neural
foraminal stenosis.

L5-S1: Mild mostly midline disc bulging. Mild to moderate facet
hypertrophy greater on the right. No significant stenosis.
IMPRESSION: 1. Dextroconvex lumbar scoliosis with no acute osseous abnormality.
2. Widespread chronic lumbar disc and endplate degeneration. No
lumbar spinal stenosis. Up to moderate neural foraminal stenosis at
the left L2, left L3 and right L4 nerve levels.

## 2022-08-28 DIAGNOSIS — Z1231 Encounter for screening mammogram for malignant neoplasm of breast: Secondary | ICD-10-CM | POA: Diagnosis not present

## 2022-08-28 DIAGNOSIS — E663 Overweight: Secondary | ICD-10-CM | POA: Diagnosis not present

## 2022-08-28 DIAGNOSIS — Z6827 Body mass index (BMI) 27.0-27.9, adult: Secondary | ICD-10-CM | POA: Diagnosis not present

## 2022-08-28 DIAGNOSIS — Z1211 Encounter for screening for malignant neoplasm of colon: Secondary | ICD-10-CM | POA: Diagnosis not present

## 2022-08-28 DIAGNOSIS — E038 Other specified hypothyroidism: Secondary | ICD-10-CM | POA: Diagnosis not present

## 2022-08-28 DIAGNOSIS — E7800 Pure hypercholesterolemia, unspecified: Secondary | ICD-10-CM | POA: Diagnosis not present

## 2022-08-28 DIAGNOSIS — I10 Essential (primary) hypertension: Secondary | ICD-10-CM | POA: Diagnosis not present

## 2022-09-07 DIAGNOSIS — G8929 Other chronic pain: Secondary | ICD-10-CM | POA: Diagnosis not present

## 2022-09-07 DIAGNOSIS — M1711 Unilateral primary osteoarthritis, right knee: Secondary | ICD-10-CM | POA: Diagnosis not present

## 2022-09-07 DIAGNOSIS — R296 Repeated falls: Secondary | ICD-10-CM | POA: Diagnosis not present

## 2022-09-07 DIAGNOSIS — M25561 Pain in right knee: Secondary | ICD-10-CM | POA: Diagnosis not present

## 2022-09-13 DIAGNOSIS — Z1231 Encounter for screening mammogram for malignant neoplasm of breast: Secondary | ICD-10-CM | POA: Diagnosis not present

## 2022-10-30 DIAGNOSIS — D225 Melanocytic nevi of trunk: Secondary | ICD-10-CM | POA: Diagnosis not present

## 2022-10-30 DIAGNOSIS — L820 Inflamed seborrheic keratosis: Secondary | ICD-10-CM | POA: Diagnosis not present

## 2022-10-30 DIAGNOSIS — L578 Other skin changes due to chronic exposure to nonionizing radiation: Secondary | ICD-10-CM | POA: Diagnosis not present

## 2022-10-30 DIAGNOSIS — L814 Other melanin hyperpigmentation: Secondary | ICD-10-CM | POA: Diagnosis not present

## 2022-11-27 DIAGNOSIS — K649 Unspecified hemorrhoids: Secondary | ICD-10-CM | POA: Diagnosis not present

## 2022-11-27 DIAGNOSIS — K5790 Diverticulosis of intestine, part unspecified, without perforation or abscess without bleeding: Secondary | ICD-10-CM | POA: Diagnosis not present

## 2023-01-02 DIAGNOSIS — Z1211 Encounter for screening for malignant neoplasm of colon: Secondary | ICD-10-CM | POA: Diagnosis not present

## 2023-01-02 DIAGNOSIS — K648 Other hemorrhoids: Secondary | ICD-10-CM | POA: Diagnosis not present

## 2023-01-02 DIAGNOSIS — K5730 Diverticulosis of large intestine without perforation or abscess without bleeding: Secondary | ICD-10-CM | POA: Diagnosis not present

## 2023-01-27 DIAGNOSIS — R051 Acute cough: Secondary | ICD-10-CM | POA: Diagnosis not present

## 2023-01-27 DIAGNOSIS — J324 Chronic pansinusitis: Secondary | ICD-10-CM | POA: Diagnosis not present

## 2023-01-27 DIAGNOSIS — R0981 Nasal congestion: Secondary | ICD-10-CM | POA: Diagnosis not present

## 2023-02-28 DIAGNOSIS — Z1389 Encounter for screening for other disorder: Secondary | ICD-10-CM | POA: Diagnosis not present

## 2023-02-28 DIAGNOSIS — I10 Essential (primary) hypertension: Secondary | ICD-10-CM | POA: Diagnosis not present

## 2023-02-28 DIAGNOSIS — Z0000 Encounter for general adult medical examination without abnormal findings: Secondary | ICD-10-CM | POA: Diagnosis not present

## 2023-02-28 DIAGNOSIS — E038 Other specified hypothyroidism: Secondary | ICD-10-CM | POA: Diagnosis not present

## 2023-02-28 DIAGNOSIS — Z6827 Body mass index (BMI) 27.0-27.9, adult: Secondary | ICD-10-CM | POA: Diagnosis not present

## 2023-02-28 DIAGNOSIS — E7800 Pure hypercholesterolemia, unspecified: Secondary | ICD-10-CM | POA: Diagnosis not present

## 2023-02-28 DIAGNOSIS — E663 Overweight: Secondary | ICD-10-CM | POA: Diagnosis not present

## 2023-03-10 DIAGNOSIS — M542 Cervicalgia: Secondary | ICD-10-CM | POA: Diagnosis not present

## 2023-03-20 DIAGNOSIS — M5416 Radiculopathy, lumbar region: Secondary | ICD-10-CM | POA: Diagnosis not present

## 2023-03-22 DIAGNOSIS — M5416 Radiculopathy, lumbar region: Secondary | ICD-10-CM | POA: Diagnosis not present

## 2023-04-27 DIAGNOSIS — N179 Acute kidney failure, unspecified: Secondary | ICD-10-CM | POA: Diagnosis not present

## 2023-04-27 DIAGNOSIS — S14109A Unspecified injury at unspecified level of cervical spinal cord, initial encounter: Secondary | ICD-10-CM | POA: Diagnosis not present

## 2023-04-27 DIAGNOSIS — S12110A Anterior displaced Type II dens fracture, initial encounter for closed fracture: Secondary | ICD-10-CM | POA: Diagnosis not present

## 2023-04-27 DIAGNOSIS — M542 Cervicalgia: Secondary | ICD-10-CM | POA: Diagnosis not present

## 2023-04-27 DIAGNOSIS — S12031A Nondisplaced posterior arch fracture of first cervical vertebra, initial encounter for closed fracture: Secondary | ICD-10-CM | POA: Diagnosis not present

## 2023-04-27 DIAGNOSIS — I10 Essential (primary) hypertension: Secondary | ICD-10-CM | POA: Diagnosis not present

## 2023-04-27 DIAGNOSIS — Z79899 Other long term (current) drug therapy: Secondary | ICD-10-CM | POA: Diagnosis not present

## 2023-04-27 DIAGNOSIS — S12120A Other displaced dens fracture, initial encounter for closed fracture: Secondary | ICD-10-CM | POA: Diagnosis not present

## 2023-04-27 DIAGNOSIS — S12090A Other displaced fracture of first cervical vertebra, initial encounter for closed fracture: Secondary | ICD-10-CM | POA: Diagnosis not present

## 2023-04-27 DIAGNOSIS — W01198A Fall on same level from slipping, tripping and stumbling with subsequent striking against other object, initial encounter: Secondary | ICD-10-CM | POA: Diagnosis not present

## 2023-04-27 DIAGNOSIS — Z043 Encounter for examination and observation following other accident: Secondary | ICD-10-CM | POA: Diagnosis not present

## 2023-04-27 DIAGNOSIS — S199XXA Unspecified injury of neck, initial encounter: Secondary | ICD-10-CM | POA: Diagnosis not present

## 2023-04-27 DIAGNOSIS — R519 Headache, unspecified: Secondary | ICD-10-CM | POA: Diagnosis not present

## 2023-04-27 DIAGNOSIS — S1202XA Unstable burst fracture of first cervical vertebra, initial encounter for closed fracture: Secondary | ICD-10-CM | POA: Diagnosis not present

## 2023-04-27 DIAGNOSIS — E785 Hyperlipidemia, unspecified: Secondary | ICD-10-CM | POA: Diagnosis not present

## 2023-04-27 DIAGNOSIS — Z79890 Hormone replacement therapy (postmenopausal): Secondary | ICD-10-CM | POA: Diagnosis not present

## 2023-04-27 DIAGNOSIS — M546 Pain in thoracic spine: Secondary | ICD-10-CM | POA: Diagnosis not present

## 2023-04-27 DIAGNOSIS — S12190A Other displaced fracture of second cervical vertebra, initial encounter for closed fracture: Secondary | ICD-10-CM | POA: Diagnosis not present

## 2023-04-27 DIAGNOSIS — S12100A Unspecified displaced fracture of second cervical vertebra, initial encounter for closed fracture: Secondary | ICD-10-CM | POA: Diagnosis not present

## 2023-04-27 DIAGNOSIS — E039 Hypothyroidism, unspecified: Secondary | ICD-10-CM | POA: Diagnosis not present

## 2023-04-27 DIAGNOSIS — S0990XA Unspecified injury of head, initial encounter: Secondary | ICD-10-CM | POA: Diagnosis not present

## 2023-04-27 DIAGNOSIS — S129XXA Fracture of neck, unspecified, initial encounter: Secondary | ICD-10-CM | POA: Insufficient documentation

## 2023-04-27 DIAGNOSIS — S1201XA Stable burst fracture of first cervical vertebra, initial encounter for closed fracture: Secondary | ICD-10-CM | POA: Diagnosis not present

## 2023-04-27 DIAGNOSIS — Z7401 Bed confinement status: Secondary | ICD-10-CM | POA: Diagnosis not present

## 2023-04-27 DIAGNOSIS — S12000A Unspecified displaced fracture of first cervical vertebra, initial encounter for closed fracture: Secondary | ICD-10-CM | POA: Diagnosis not present

## 2023-04-28 DIAGNOSIS — S12100A Unspecified displaced fracture of second cervical vertebra, initial encounter for closed fracture: Secondary | ICD-10-CM | POA: Diagnosis not present

## 2023-04-28 DIAGNOSIS — S12000A Unspecified displaced fracture of first cervical vertebra, initial encounter for closed fracture: Secondary | ICD-10-CM | POA: Diagnosis not present

## 2023-04-28 DIAGNOSIS — S14109A Unspecified injury at unspecified level of cervical spinal cord, initial encounter: Secondary | ICD-10-CM | POA: Insufficient documentation

## 2023-05-08 DIAGNOSIS — S1201XA Stable burst fracture of first cervical vertebra, initial encounter for closed fracture: Secondary | ICD-10-CM | POA: Diagnosis not present

## 2023-05-08 DIAGNOSIS — S12121A Other nondisplaced dens fracture, initial encounter for closed fracture: Secondary | ICD-10-CM | POA: Diagnosis not present

## 2023-05-16 DIAGNOSIS — T1490XA Injury, unspecified, initial encounter: Secondary | ICD-10-CM | POA: Diagnosis not present

## 2023-05-16 DIAGNOSIS — S14109A Unspecified injury at unspecified level of cervical spinal cord, initial encounter: Secondary | ICD-10-CM | POA: Diagnosis not present

## 2023-05-16 DIAGNOSIS — S129XXD Fracture of neck, unspecified, subsequent encounter: Secondary | ICD-10-CM | POA: Diagnosis not present

## 2023-05-16 DIAGNOSIS — S12031A Nondisplaced posterior arch fracture of first cervical vertebra, initial encounter for closed fracture: Secondary | ICD-10-CM | POA: Diagnosis not present

## 2023-06-20 DIAGNOSIS — Z4789 Encounter for other orthopedic aftercare: Secondary | ICD-10-CM | POA: Diagnosis not present

## 2023-06-20 DIAGNOSIS — S14109A Unspecified injury at unspecified level of cervical spinal cord, initial encounter: Secondary | ICD-10-CM | POA: Diagnosis not present

## 2023-06-20 DIAGNOSIS — S129XXD Fracture of neck, unspecified, subsequent encounter: Secondary | ICD-10-CM | POA: Diagnosis not present

## 2023-06-24 DIAGNOSIS — N3090 Cystitis, unspecified without hematuria: Secondary | ICD-10-CM | POA: Diagnosis not present

## 2023-06-24 DIAGNOSIS — R300 Dysuria: Secondary | ICD-10-CM | POA: Diagnosis not present

## 2023-07-05 DIAGNOSIS — N3000 Acute cystitis without hematuria: Secondary | ICD-10-CM | POA: Diagnosis not present

## 2023-07-24 DIAGNOSIS — S129XXD Fracture of neck, unspecified, subsequent encounter: Secondary | ICD-10-CM | POA: Diagnosis not present

## 2023-07-24 DIAGNOSIS — S12110D Anterior displaced Type II dens fracture, subsequent encounter for fracture with routine healing: Secondary | ICD-10-CM | POA: Diagnosis not present

## 2023-08-27 ENCOUNTER — Other Ambulatory Visit: Payer: Self-pay | Admitting: Family Medicine

## 2023-08-27 DIAGNOSIS — N1831 Chronic kidney disease, stage 3a: Secondary | ICD-10-CM | POA: Diagnosis not present

## 2023-08-27 DIAGNOSIS — E038 Other specified hypothyroidism: Secondary | ICD-10-CM | POA: Diagnosis not present

## 2023-08-27 DIAGNOSIS — E663 Overweight: Secondary | ICD-10-CM | POA: Diagnosis not present

## 2023-08-27 DIAGNOSIS — Z1231 Encounter for screening mammogram for malignant neoplasm of breast: Secondary | ICD-10-CM | POA: Diagnosis not present

## 2023-08-27 DIAGNOSIS — Z6827 Body mass index (BMI) 27.0-27.9, adult: Secondary | ICD-10-CM | POA: Diagnosis not present

## 2023-08-27 DIAGNOSIS — E7800 Pure hypercholesterolemia, unspecified: Secondary | ICD-10-CM | POA: Diagnosis not present

## 2023-08-27 DIAGNOSIS — I10 Essential (primary) hypertension: Secondary | ICD-10-CM | POA: Diagnosis not present

## 2023-09-25 DIAGNOSIS — S129XXA Fracture of neck, unspecified, initial encounter: Secondary | ICD-10-CM | POA: Diagnosis not present

## 2023-09-25 DIAGNOSIS — S129XXD Fracture of neck, unspecified, subsequent encounter: Secondary | ICD-10-CM | POA: Diagnosis not present

## 2023-09-25 DIAGNOSIS — S12100D Unspecified displaced fracture of second cervical vertebra, subsequent encounter for fracture with routine healing: Secondary | ICD-10-CM | POA: Diagnosis not present

## 2023-09-28 ENCOUNTER — Ambulatory Visit
Admission: RE | Admit: 2023-09-28 | Discharge: 2023-09-28 | Disposition: A | Discharge status: Home / Self Care | Source: Ambulatory Visit | Attending: Family Medicine | Admitting: Family Medicine

## 2023-09-28 DIAGNOSIS — Z1231 Encounter for screening mammogram for malignant neoplasm of breast: Secondary | ICD-10-CM | POA: Diagnosis not present

## 2023-10-31 DIAGNOSIS — L814 Other melanin hyperpigmentation: Secondary | ICD-10-CM | POA: Diagnosis not present

## 2023-10-31 DIAGNOSIS — D225 Melanocytic nevi of trunk: Secondary | ICD-10-CM | POA: Diagnosis not present

## 2023-10-31 DIAGNOSIS — L820 Inflamed seborrheic keratosis: Secondary | ICD-10-CM | POA: Diagnosis not present

## 2023-10-31 DIAGNOSIS — L578 Other skin changes due to chronic exposure to nonionizing radiation: Secondary | ICD-10-CM | POA: Diagnosis not present

## 2023-10-31 DIAGNOSIS — L570 Actinic keratosis: Secondary | ICD-10-CM | POA: Diagnosis not present

## 2023-12-10 DIAGNOSIS — L570 Actinic keratosis: Secondary | ICD-10-CM | POA: Diagnosis not present

## 2024-02-29 DIAGNOSIS — Z1159 Encounter for screening for other viral diseases: Secondary | ICD-10-CM | POA: Diagnosis not present

## 2024-02-29 DIAGNOSIS — M810 Age-related osteoporosis without current pathological fracture: Secondary | ICD-10-CM | POA: Diagnosis not present

## 2024-02-29 DIAGNOSIS — Z6827 Body mass index (BMI) 27.0-27.9, adult: Secondary | ICD-10-CM | POA: Diagnosis not present

## 2024-02-29 DIAGNOSIS — Z0000 Encounter for general adult medical examination without abnormal findings: Secondary | ICD-10-CM | POA: Diagnosis not present

## 2024-02-29 DIAGNOSIS — E663 Overweight: Secondary | ICD-10-CM | POA: Diagnosis not present

## 2024-02-29 DIAGNOSIS — Z1389 Encounter for screening for other disorder: Secondary | ICD-10-CM | POA: Diagnosis not present

## 2024-03-03 ENCOUNTER — Encounter: Payer: Self-pay | Admitting: Obstetrics and Gynecology

## 2024-03-04 ENCOUNTER — Encounter: Payer: Self-pay | Admitting: Obstetrics and Gynecology

## 2024-03-04 ENCOUNTER — Other Ambulatory Visit: Payer: Self-pay

## 2024-03-04 ENCOUNTER — Ambulatory Visit: Admitting: Obstetrics and Gynecology

## 2024-03-04 VITALS — BP 152/92 | HR 70 | Wt 161.7 lb

## 2024-03-04 DIAGNOSIS — D219 Benign neoplasm of connective and other soft tissue, unspecified: Secondary | ICD-10-CM

## 2024-03-04 DIAGNOSIS — E079 Disorder of thyroid, unspecified: Secondary | ICD-10-CM | POA: Insufficient documentation

## 2024-03-04 DIAGNOSIS — Z7689 Persons encountering health services in other specified circumstances: Secondary | ICD-10-CM

## 2024-03-04 DIAGNOSIS — E7800 Pure hypercholesterolemia, unspecified: Secondary | ICD-10-CM | POA: Insufficient documentation

## 2024-03-04 NOTE — Progress Notes (Unsigned)
   NEW GYNECOLOGY PATIENT Patient name: Samantha Oneal MRN [id number]  Date of birth: [birth year] Chief Complaint:   Establish Care     History:  Here to establish care and follow up on CT A/P findings. No pelvic pain or pressure, LMP at 55. No abnormal vaginal discharge. Last pap smear a few years when they stopped around 54 when insurance stopped. CT scan done when injured.      Gynecologic History No LMP recorded. Patient is postmenopausal. Contraception: post menopausal status   OB History  No obstetric history on file.     The following portions of the patient's history were reviewed and updated as appropriate: allergies, current medications, past family history, past medical history, past social history, past surgical history and problem list. Health Maintenance  Topic Date Due  . COVID-19 Vaccine (1) Never done  . Hepatitis C Screening  Never done  . DTaP/Tdap/Td vaccine (1 - Tdap) Never done  . Zoster (Shingles) Vaccine (1 of 2) Never done  . Colon Cancer Screening  Never done  . Pneumococcal Vaccine for age over 37 (1 of 1 - PCV) Never done  . DEXA scan (bone density measurement)  Never done  . Flu Shot  Never done  . Medicare Annual Wellness Visit  02/28/2024  . Breast Cancer Screening  09/27/2025  . HPV Vaccine  Aged Out  . Meningitis B Vaccine  Aged Out     Review of Systems Pertinent items noted in HPI and remainder of comprehensive ROS otherwise negative.  Physical Exam:  BP (!) 152/92   Pulse 70   Wt 161 lb 11.2 oz (73.3 kg)  Physical Exam Vitals and nursing note reviewed.  Constitutional:      Appearance: Normal appearance.  Cardiovascular:     Rate and Rhythm: Normal rate.  Pulmonary:     Effort: Pulmonary effort is normal.     Breath sounds: Normal breath sounds.  Neurological:     General: No focal deficit present.     Mental Status: She is alert and oriented to person, place, and time.  Psychiatric:        Mood and Affect: Mood normal.         Behavior: Behavior normal.        Thought Content: Thought content normal.        Judgment: Judgment normal.        Assessment and Plan:     Follow-up: No follow-ups on file.      Carter Quarry, MD Obstetrician & Gynecologist, [facility] [employer], [facility]

## 2024-03-14 DIAGNOSIS — E039 Hypothyroidism, unspecified: Secondary | ICD-10-CM | POA: Diagnosis not present

## 2024-03-14 DIAGNOSIS — E663 Overweight: Secondary | ICD-10-CM | POA: Diagnosis not present

## 2024-03-14 DIAGNOSIS — Z1159 Encounter for screening for other viral diseases: Secondary | ICD-10-CM | POA: Diagnosis not present

## 2024-03-14 DIAGNOSIS — F3342 Major depressive disorder, recurrent, in full remission: Secondary | ICD-10-CM | POA: Diagnosis not present

## 2024-03-14 DIAGNOSIS — E7800 Pure hypercholesterolemia, unspecified: Secondary | ICD-10-CM | POA: Diagnosis not present

## 2024-03-14 DIAGNOSIS — Z6826 Body mass index (BMI) 26.0-26.9, adult: Secondary | ICD-10-CM | POA: Diagnosis not present

## 2024-03-14 DIAGNOSIS — M810 Age-related osteoporosis without current pathological fracture: Secondary | ICD-10-CM | POA: Diagnosis not present

## 2024-03-14 DIAGNOSIS — I10 Essential (primary) hypertension: Secondary | ICD-10-CM | POA: Diagnosis not present

## 2024-04-02 DIAGNOSIS — I10 Essential (primary) hypertension: Secondary | ICD-10-CM | POA: Diagnosis not present

## 2024-04-11 DIAGNOSIS — G479 Sleep disorder, unspecified: Secondary | ICD-10-CM | POA: Diagnosis not present

## 2024-04-11 DIAGNOSIS — I10 Essential (primary) hypertension: Secondary | ICD-10-CM | POA: Diagnosis not present

## 2024-04-14 DIAGNOSIS — H6121 Impacted cerumen, right ear: Secondary | ICD-10-CM | POA: Diagnosis not present

## 2024-04-14 DIAGNOSIS — J329 Chronic sinusitis, unspecified: Secondary | ICD-10-CM | POA: Diagnosis not present

## 2024-04-14 DIAGNOSIS — R300 Dysuria: Secondary | ICD-10-CM | POA: Diagnosis not present

## 2024-04-29 DIAGNOSIS — M5416 Radiculopathy, lumbar region: Secondary | ICD-10-CM | POA: Diagnosis not present
